# Patient Record
Sex: Male | Born: 1995
Health system: Southern US, Community
[De-identification: ages and names within clinical notes are randomized; demographics above are authoritative.]

---
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
## 2016-06-27 ENCOUNTER — Emergency Department (HOSPITAL_COMMUNITY)
Admission: EM | Admit: 2016-06-27 | Discharge: 2016-06-27 | Disposition: A | Discharge status: Home / Self Care | Payer: 59 | Attending: Emergency Medicine | Admitting: Emergency Medicine

## 2016-06-27 ENCOUNTER — Encounter (HOSPITAL_COMMUNITY): Payer: Self-pay | Admitting: Emergency Medicine

## 2016-06-27 DIAGNOSIS — Y999 Unspecified external cause status: Secondary | ICD-10-CM | POA: Diagnosis not present

## 2016-06-27 DIAGNOSIS — S81012A Laceration without foreign body, left knee, initial encounter: Secondary | ICD-10-CM | POA: Insufficient documentation

## 2016-06-27 DIAGNOSIS — Y939 Activity, unspecified: Secondary | ICD-10-CM | POA: Insufficient documentation

## 2016-06-27 DIAGNOSIS — W293XXA Contact with powered garden and outdoor hand tools and machinery, initial encounter: Secondary | ICD-10-CM | POA: Diagnosis not present

## 2016-06-27 DIAGNOSIS — Z23 Encounter for immunization: Secondary | ICD-10-CM | POA: Diagnosis not present

## 2016-06-27 DIAGNOSIS — Y929 Unspecified place or not applicable: Secondary | ICD-10-CM | POA: Insufficient documentation

## 2016-06-27 DIAGNOSIS — S81812A Laceration without foreign body, left lower leg, initial encounter: Secondary | ICD-10-CM

## 2016-06-27 MED ORDER — TETANUS-DIPHTH-ACELL PERTUSSIS 5-2.5-18.5 LF-MCG/0.5 IM SUSP
0.5000 mL | Freq: Once | INTRAMUSCULAR | Status: AC
  Administered 2016-06-27: 0.5 mL via INTRAMUSCULAR
  Filled 2016-06-27: qty 0.5

## 2016-06-27 MED ORDER — LIDOCAINE HCL 2 % IJ SOLN
INTRAMUSCULAR | Status: AC
  Administered 2016-06-27: 400 mg
  Filled 2016-06-27: qty 20

## 2016-06-27 NOTE — ED Provider Notes (Signed)
[facility]-EMERGENCY DEPT Provider Note   CSN: [id number] Arrival date & time: 06/27/16  1305  By signing my name below, I, [name], attest that this documentation has been prepared under the direction and in the presence of  [name], PA-C. Electronically Signed: [name], ED Scribe. 06/27/16. 2:03 PM.    History   Chief Complaint Chief Complaint  Patient presents with  . Extremity Laceration   The history is provided by the patient. No language interpreter was used.   HPI Comments:  Kenneth Castro is a 20 y.o. male who presents to the Emergency Department complaining of sudden onset moderate laceration with controlled bleeding to his left knee s/p an incident which occurred 1 hour ago. Pt states he was using a chainsaw when he sustained his injury. He is ambulatory. No alleviating factors noted. Pt denies being on any blood thinners any any other complaints at this time.   History reviewed. No pertinent past medical history.  There are no active problems to display for this patient.   History reviewed. No pertinent surgical history.   Home Medications    Prior to Admission medications   Not on File    Family History No family history on file.  Social History Social History  Substance Use Topics  . Smoking status: Never Smoker  . Smokeless tobacco: Not on file  . Alcohol use Yes     Allergies   Patient has no allergy information on record.   Review of Systems Review of Systems  Musculoskeletal: Negative for arthralgias.  Skin: Positive for wound.  All other systems reviewed and are negative.    Physical Exam Updated Vital Signs BP 157/90 (BP Location: Right Arm)   Pulse 99   Temp 98.7 F (37.1 C) (Oral)   Resp 18   SpO2 99%   Physical Exam  Constitutional: He is oriented to person, place, and time. He appears well-developed and well-nourished. No distress.  HENT:  Head: Normocephalic and atraumatic.  Eyes: Conjunctivae are normal.    Cardiovascular: Normal rate.   Pulmonary/Chest: Effort normal.  Abdominal: He exhibits no distension.  Musculoskeletal:  Right knee; Full active ROM without pain. No bony tenderness.  Neurological: He is alert and oriented to person, place, and time.  Strength and sensation intact throughout bilateral lower extremity.   Skin: Skin is warm and dry.  5 cm linear full thickness laceration to superior lateral left knee. No visualization or palpation of foreign body within a bloodless field.   Psychiatric: He has a normal mood and affect.  Nursing note and vitals reviewed.  ED Treatments / Results  DIAGNOSTIC STUDIES:  Oxygen Saturation is 99% on RA, normal by my interpretation.    COORDINATION OF CARE:  1:49 PM Discussed treatment plan with pt at bedside and pt agreed to plan.  Labs (all labs ordered are listed, but only abnormal results are displayed) Labs Reviewed - No data to display  EKG  EKG Interpretation None       Radiology No results found.  Procedures Procedures (including critical care time)  LACERATION REPAIR Performed by: [name] Consent: Verbal consent obtained. Risks and benefits: risks, benefits and alternatives were discussed Patient identity confirmed: provided demographic data Time out performed prior to procedure Prepped and Draped in normal sterile fashion Wound explored Laceration Location: left knee Laceration Length: 5cm No Foreign Bodies seen or palpated Anesthesia: local infiltration Local anesthetic: lidocaine 1% without epinephrine Anesthetic total: 10 ml Irrigation method: syringe Amount of cleaning: standard  Skin closure: 4-0 ethilon Number of sutures or staples: 9 Technique: simple interrupted Patient tolerance: Patient tolerated the procedure well with no immediate complications.   Medications Ordered in ED Medications  lidocaine (XYLOCAINE) 2 % (with pres) injection (not administered)  Tdap (BOOSTRIX) injection 0.5 mL  (not administered)     Initial Impression / Assessment and Plan / ED Course  I have reviewed the triage vital signs and the nursing notes.  Pertinent labs & imaging results that were available during my care of the patient were reviewed by me and considered in my medical decision making (see chart for details).  Clinical Course     Tetanus updated in ED. Laceration occurred < 12 hours prior to repair. Discussed laceration care with pt and answered questions. Pt to f-u for suture removal in 10-14 days and wound check sooner should there be signs of dehiscence or infection. Pt is hemodynamically stable with no complaints prior to dc.     Final Clinical Impressions(s) / ED Diagnoses   Final diagnoses:  Laceration of left lower extremity, initial encounter    New Prescriptions New Prescriptions   No medications on file   I personally performed the services described in this documentation, which was scribed in my presence. The recorded information has been reviewed and is accurate.     [name], PA-C 06/27/16 1443    [name], DO 06/27/16 [phone]

## 2016-06-27 NOTE — Discharge Instructions (Signed)
Take Tylenol or Motrin for pain. Keep your wound clean and dry. Return to the emergency department in 10-14 days for suture removal. Return sooner if you develop fever, increased redness, swelling, pus drainage, or any new or concerning symptoms.

## 2016-06-27 NOTE — ED Notes (Signed)
Patient states that was using a chainsaw today and it kicked back and came across his knee.  Patient has 1 1/2 inch laceration on the left lateral knee.  Bleeding is controlled, patient does not c/o any pain at this time.

## 2016-06-27 NOTE — ED Notes (Signed)
Suture cart and lidocaine at bedside.  

## 2016-06-27 NOTE — ED Notes (Signed)
Patient d/c'd self care.  F/U reviewed.  Patient verbalized understanding. 

## 2016-06-27 NOTE — ED Triage Notes (Signed)
Per pt, states chain saw to left leg-2 inch laceration-bleeding controlled

## 2017-02-02 ENCOUNTER — Ambulatory Visit (INDEPENDENT_AMBULATORY_CARE_PROVIDER_SITE_OTHER): Payer: 59 | Admitting: Primary Care

## 2017-02-02 ENCOUNTER — Encounter: Payer: Self-pay | Admitting: Primary Care

## 2017-02-02 VITALS — BP 130/84 | HR 87 | Temp 98.4°F | Ht 68.5 in | Wt 166.8 lb

## 2017-02-02 DIAGNOSIS — R5383 Other fatigue: Secondary | ICD-10-CM

## 2017-02-02 LAB — CBC
HCT: 46.7 % (ref 39.0–52.0)
Hemoglobin: 16.2 g/dL (ref 13.0–17.0)
MCHC: 34.7 g/dL (ref 30.0–36.0)
MCV: 84.8 fl (ref 78.0–100.0)
Platelets: 225 10*3/uL (ref 150.0–400.0)
RBC: 5.51 Mil/uL (ref 4.22–5.81)
RDW: 12.1 % (ref 11.5–14.6)
WBC: 7.8 10*3/uL (ref 4.5–10.5)

## 2017-02-02 LAB — COMPREHENSIVE METABOLIC PANEL
ALT: 18 U/L (ref 0–53)
AST: 18 U/L (ref 0–37)
Albumin: 5 g/dL (ref 3.5–5.2)
Alkaline Phosphatase: 52 U/L (ref 39–117)
BUN: 16 mg/dL (ref 6–23)
CO2: 28 mEq/L (ref 19–32)
Calcium: 10.1 mg/dL (ref 8.4–10.5)
Chloride: 104 mEq/L (ref 96–112)
Creatinine, Ser: 1.36 mg/dL (ref 0.40–1.50)
GFR: 70.68 mL/min (ref >60.00)
Glucose, Bld: 81 mg/dL (ref 70–99)
Potassium: 4.1 mEq/L (ref 3.5–5.1)
Sodium: 141 mEq/L (ref 135–145)
Total Bilirubin: 1 mg/dL (ref 0.2–1.2)
Total Protein: 7.5 g/dL (ref 6.0–8.3)

## 2017-02-02 LAB — VITAMIN B12: Vitamin B-12: 547 pg/mL (ref 211–911)

## 2017-02-02 LAB — TSH: TSH: 1.61 u[IU]/mL (ref 0.35–5.50)

## 2017-02-02 LAB — VITAMIN D 25 HYDROXY (VIT D DEFICIENCY, FRACTURES): VITD: 53.09 ng/mL (ref 30.00–100.00)

## 2017-02-02 NOTE — Patient Instructions (Signed)
Complete lab work prior to leaving today.  You should be getting 7-9 hours of sleep every night.  It's important to improve your diet by reducing consumption of fast food, fried food, processed snack foods, sugary drinks. Increase consumption of fresh vegetables and fruits, whole grains, water.  Ensure you are drinking 64 ounces of water daily.  I'll be in touch once I receive your lab results.  It was a pleasure to meet you today! Please don't hesitate to call me with any questions. Welcome to [facility]!   Fatigue Fatigue is feeling tired all of the time, a lack of energy, or a lack of motivation. Occasional or mild fatigue is often a normal response to activity or life in general. However, long-lasting (chronic) or extreme fatigue may indicate an underlying medical condition. Follow these instructions at home: Watch your fatigue for any changes. The following actions may help to lessen any discomfort you are feeling:  Talk to your health care provider about how much sleep you need each night. Try to get the required amount every night.  Take medicines only as directed by your health care provider.  Eat a healthy and nutritious diet. Ask your health care provider if you need help changing your diet.  Drink enough fluid to keep your urine clear or pale yellow.  Practice ways of relaxing, such as yoga, meditation, massage therapy, or acupuncture.  Exercise regularly.  Change situations that cause you stress. Try to keep your work and personal routine reasonable.  Do not abuse illegal drugs.  Limit alcohol intake to no more than 1 drink per day for nonpregnant women and 2 drinks per day for men. One drink equals 12 ounces of beer, 5 ounces of wine, or 1 ounces of hard liquor.  Take a multivitamin, if directed by your health care provider.  Contact a health care provider if:  Your fatigue does not get better.  You have a fever.  You have unintentional weight loss or  gain.  You have headaches.  You have difficulty: ? Falling asleep. ? Sleeping throughout the night.  You feel angry, guilty, anxious, or sad.  You are unable to have a bowel movement (constipation).  You skin is dry.  Your legs or another part of your body is swollen. Get help right away if:  You feel confused.  Your vision is blurry.  You feel faint or pass out.  You have a severe headache.  You have severe abdominal, pelvic, or back pain.  You have chest pain, shortness of breath, or an irregular or fast heartbeat.  You are unable to urinate or you urinate less than normal.  You develop abnormal bleeding, such as bleeding from the rectum, vagina, nose, lungs, or nipples.  You vomit blood.  You have thoughts about harming yourself or committing suicide.  You are worried that you might harm someone else. This information is not intended to replace advice given to you by your health care provider. Make sure you discuss any questions you have with your health care provider. Document Released: 05/31/2007 Document Revised: 01/09/2016 Document Reviewed: 12/05/2013 Elsevier Interactive Patient Education  [employer]2018 Elsevier Inc.

## 2017-02-02 NOTE — Assessment & Plan Note (Signed)
Present for 2 years. Does have some mild depression, unlikely to be contributing. Suspect this could be contributed to his limited amount of sleep, poor diet, and lack of exercise.  Will check labs today to rule out metabolic cause. Discussed to work on increasing sleep and improving diet. Will await lab results.

## 2017-02-02 NOTE — Progress Notes (Signed)
   Subjective:    Patient ID: Kenneth Castro, male    DOB: [birth year], 21 y.o.   MRN: [id number]  HPI  [name] is a 21 year old male who presents today to establish care and discuss the problems mentioned below. Will obtain old records.  1) Fatigue/Tired: Present for the past 2 years. He's feeling tired nearly everyday, throughout the entire day. He will take a 2 hour nap during the middle of the day. He will sleep 5-6 hours nightly on average. He's had intermittent dizziness with weakness since age 21 that will improve with rest and when he eats. He denies weakness, bloody stools, night sweats, unexplained weight loss, snoring, difficulty falling and staying asleep.  Diet currently consists of:  Breakfast: Skips Lunch: Fast food Dinner: Meat, vegetable, starch Snacks: Rice crispy treats Desserts: None Beverages: Recently water, soda  Exercise: He does not currently exercise, is active at work daily.    Review of Systems  Constitutional: Positive for fatigue. Negative for fever.  Respiratory: Negative for shortness of breath.   Cardiovascular: Negative for chest pain.  Gastrointestinal: Negative for abdominal pain and blood in stool.  Allergic/Immunologic: Positive for environmental allergies.  Neurological: Negative for weakness and headaches.  Psychiatric/Behavioral:       Mild intermittent depression, no anxiety. Overall manages well on his own.       No past medical history on file.   Social History   Social History  . Marital status: Single    Spouse name: N/A  . Number of children: N/A  . Years of education: N/A   Occupational History  . Not on file.   Social History Main Topics  . Smoking status: Never Smoker  . Smokeless tobacco: Never Used  . Alcohol use Yes  . Drug use: Unknown  . Sexual activity: Not on file   Other Topics Concern  . Not on file   Social History Narrative   Single.   No children.   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   Enjoys spending time with friends, camping, hunting, fishing.    No past surgical history on file.  Family History  Problem Relation Age of Onset  . Hypothyroidism Maternal Grandmother   . Breast cancer Paternal Grandmother   . Diabetes Paternal Grandfather   . Prostate cancer Paternal Grandfather   . Breast cancer Paternal Aunt     Allergies  Allergen Reactions  . Penicillins Swelling    No current outpatient prescriptions on file prior to visit.   No current facility-administered medications on file prior to visit.     BP 130/84   Pulse 87   Temp 98.4 F (36.9 C) (Oral)   Ht 5' 8.5" (1.74 m)   Wt 166 lb 12.8 oz (75.7 kg)   SpO2 98%   BMI 24.99 kg/m    Objective:   Physical Exam  Constitutional: He is oriented to person, place, and time. He appears well-nourished.  Eyes: EOM are normal.  Neck: Neck supple. No thyromegaly present.  Cardiovascular: Normal rate and regular rhythm.   Pulmonary/Chest: Effort normal and breath sounds normal. He has no wheezes. He has no rales.  Neurological: He is alert and oriented to person, place, and time. No cranial nerve deficit.  Skin: Skin is warm and dry.  Psychiatric: He has a normal mood and affect.          Assessment & Plan:

## 2017-05-20 ENCOUNTER — Encounter: Payer: Self-pay | Admitting: Primary Care

## 2017-05-20 ENCOUNTER — Ambulatory Visit (INDEPENDENT_AMBULATORY_CARE_PROVIDER_SITE_OTHER): Payer: 59 | Admitting: Primary Care

## 2017-05-20 VITALS — BP 142/78 | HR 78 | Temp 97.9°F | Wt 172.0 lb

## 2017-05-20 DIAGNOSIS — R350 Frequency of micturition: Secondary | ICD-10-CM | POA: Insufficient documentation

## 2017-05-20 LAB — HEMOGLOBIN A1C: HEMOGLOBIN A1C: 5.1 % (ref 4.6–6.5)

## 2017-05-20 LAB — POC URINALSYSI DIPSTICK (AUTOMATED)
Bilirubin, UA: NEGATIVE
Blood, UA: NEGATIVE
Glucose, UA: NEGATIVE
Ketones, UA: NEGATIVE
LEUKOCYTES UA: NEGATIVE
NITRITE UA: NEGATIVE
PH UA: 6 (ref 5.0–8.0)
Protein, UA: NEGATIVE
Spec Grav, UA: >=1.03 — AB (ref 1.010–1.025)
UROBILINOGEN UA: 0.2 U/dL

## 2017-05-20 NOTE — Progress Notes (Signed)
   Subjective:    Patient ID: Kenneth Castro, male    DOB: 1995/12/07, 21 y.o.   MRN: [id number]  HPI  Kenneth Castro is a 21 year old male who presents today with a chief complaint of polyuria and polydipsia. His symptoms have been present for the past 1 year. He drinks mostly water (48 ounces daily), sometimes Gatorade and soda. He also drinks up to three glasses of sweet tea with each meal. He has noticed nocturia and will get up 2-3 times nightly.   He denies hematuria, fevers, abdominal pain, penile discharge. dysuria, penile pain, testicular pain. Family history of diabetes in his father's side. Mother is prediabetic.   Diet currently consists of:  Breakfast: Bacon, egg, cheese biscuit Lunch: Meat, mac and cheese, starch (fried foods) Dinner: [employer], burgers, fries Snacks: Granola bars, chips Desserts: None Beverages: Water, sweet tea, soda, Gatorade.   Exercise:   Review of Systems  Constitutional: Positive for fatigue.  Gastrointestinal: Negative for abdominal pain, constipation, diarrhea and nausea.  Endocrine: Positive for polydipsia and polyuria.  Genitourinary: Positive for dysuria and frequency. Negative for discharge and flank pain.  Neurological: Negative for numbness.       No past medical history on file.   Social History   Social History  . Marital status: Single    Spouse name: N/A  . Number of children: N/A  . Years of education: N/A   Occupational History  . Not on file.   Social History Main Topics  . Smoking status: Never Smoker  . Smokeless tobacco: Never Used  . Alcohol use Yes  . Drug use: Unknown  . Sexual activity: Not on file   Other Topics Concern  . Not on file   Social History Narrative   Single.   No children.   Student at [employer].   Enjoys spending time with friends, camping, hunting, fishing.    No past surgical history on file.  Family History  Problem Relation Age of Onset  . Hypothyroidism  Maternal Grandmother   . Breast cancer Paternal Grandmother   . Diabetes Paternal Grandmother   . Diabetes Paternal Grandfather   . Prostate cancer Paternal Grandfather   . Breast cancer Paternal Aunt     Allergies  Allergen Reactions  . Penicillins Swelling    No current outpatient prescriptions on file prior to visit.   No current facility-administered medications on file prior to visit.     BP (!) 142/78   Pulse 78   Temp 97.9 F (36.6 C) (Oral)   Wt 172 lb (78 kg)   BMI 25.77 kg/m    Objective:   Physical Exam  Constitutional: He appears well-nourished.  Neck: Neck supple.  Cardiovascular: Normal rate and regular rhythm.   Pulmonary/Chest: Effort normal and breath sounds normal.  Abdominal: Soft. Bowel sounds are normal. There is no tenderness. There is no CVA tenderness.  Skin: Skin is warm and dry.  Psychiatric: He has a normal mood and affect.          Assessment & Plan:

## 2017-05-20 NOTE — Assessment & Plan Note (Signed)
Also with nocturia x 1 year. Work up in June for fatigue unremarkable including TSH and CMP. Doubt diabetes as a cause but will check A1C. UA today unremarkable. Discussed to stop caffeine consumption after 3 pm and to limit fluids at bedtime.  Also recommended he work on improving his diet and start exercising.

## 2017-05-20 NOTE — Patient Instructions (Addendum)
Complete lab work prior to leaving today. I will notify you of your results once received.   Ensure you are consuming 64 ounces of water daily, more if working outside.  Limit sweet tea, Gatorade, and soda.  Do not drink caffeine after 3 pm in the afternoon.  It was a pleasure to see you today!

## 2018-09-20 ENCOUNTER — Ambulatory Visit: Payer: 59 | Admitting: Primary Care

## 2018-09-20 DIAGNOSIS — Z0289 Encounter for other administrative examinations: Secondary | ICD-10-CM

## 2018-09-21 ENCOUNTER — Ambulatory Visit: Payer: 59 | Admitting: Primary Care

## 2018-09-21 ENCOUNTER — Encounter: Payer: Self-pay | Admitting: Primary Care

## 2018-09-21 VITALS — BP 130/86 | HR 76 | Temp 98.2°F | Ht 68.5 in | Wt 179.0 lb

## 2018-09-21 DIAGNOSIS — R42 Dizziness and giddiness: Secondary | ICD-10-CM

## 2018-09-21 DIAGNOSIS — R5383 Other fatigue: Secondary | ICD-10-CM | POA: Diagnosis not present

## 2018-09-21 DIAGNOSIS — R531 Weakness: Secondary | ICD-10-CM | POA: Diagnosis not present

## 2018-09-21 LAB — COMPREHENSIVE METABOLIC PANEL
ALBUMIN: 4.6 g/dL (ref 3.5–5.2)
ALT: 21 U/L (ref 0–53)
AST: 15 U/L (ref 0–37)
Alkaline Phosphatase: 55 U/L (ref 39–117)
BUN: 11 mg/dL (ref 6–23)
CHLORIDE: 102 meq/L (ref 96–112)
CO2: 30 mEq/L (ref 19–32)
Calcium: 9.7 mg/dL (ref 8.4–10.5)
Creatinine, Ser: 1.2 mg/dL (ref 0.40–1.50)
GFR: 75.64 mL/min (ref >60.00)
Glucose, Bld: 81 mg/dL (ref 70–99)
POTASSIUM: 4.5 meq/L (ref 3.5–5.1)
Sodium: 141 mEq/L (ref 135–145)
Total Bilirubin: 0.7 mg/dL (ref 0.2–1.2)
Total Protein: 7 g/dL (ref 6.0–8.3)

## 2018-09-21 LAB — VITAMIN B12: Vitamin B-12: 464 pg/mL (ref 211–911)

## 2018-09-21 LAB — CBC
HEMATOCRIT: 47.9 % (ref 39.0–52.0)
Hemoglobin: 16.4 g/dL (ref 13.0–17.0)
MCHC: 34.3 g/dL (ref 30.0–36.0)
MCV: 86.1 fl (ref 78.0–100.0)
Platelets: 206 10*3/uL (ref 150.0–400.0)
RBC: 5.56 Mil/uL (ref 4.22–5.81)
RDW: 12.3 % (ref 11.5–15.5)
WBC: 5.7 10*3/uL (ref 4.0–10.5)

## 2018-09-21 LAB — VITAMIN D 25 HYDROXY (VIT D DEFICIENCY, FRACTURES): VITD: 31.25 ng/mL (ref 30.00–100.00)

## 2018-09-21 LAB — TSH: TSH: 1.91 u[IU]/mL (ref 0.35–4.50)

## 2018-09-21 NOTE — Progress Notes (Signed)
Subjective:    Patient ID: Kenneth Castro, male    DOB: February 08, 1996, 23 y.o.   MRN: [id number]  HPI  [name] is a 23 year old male with a history of chronic fatigue who presents today with a chief complaint of fatigue.  He was evaluated in June 2018 with reports of a two year history of fatigue, feeling tired daily. He endorsed napping 2 hours during the day, sleeping 5-6 hours nightly.  Since his last visit he continues to feel fatigued. He endorses a healthier diet, is sleeping 7-8 hours nightly. He wakes around 7-8 am and feels tired, doesn't wake during the night. Feels sluggish throughout the day. He is napping for 2 hours 1-2 times weekly. He will experience symptoms of feeling shaky, light headed as though be may pass out. His legs will feel like "jello". This occurs 1-2 times weekly and has been so since the age of 23.   He denies feeling depressed and anxious, unexplained weight loss, bloody stools, night sweats, penile discharge, nausea, dysuria. He's not sure if he snores, denies waking during the night.   BP Readings from Last 3 Encounters:  09/21/18 130/86  05/20/17 (!) 142/78  02/02/17 130/84   Diet currently consists of:  Breakfast: Cereal Lunch: Sandwich, chips, sometimes fast food Dinner: Restaurants, meat, vegetable, starch Snacks: Crackers Desserts: 2-3 times weekly  Beverages: Water, sweet tea, some soda, social alcohol  Exercise: He is not exercising   Review of Systems  Constitutional: Positive for fatigue. Negative for appetite change and unexpected weight change.  Respiratory: Negative for shortness of breath.   Cardiovascular: Negative for chest pain and palpitations.  Gastrointestinal: Negative for nausea.  Musculoskeletal: Negative for arthralgias.  Neurological:       Intermittent symptoms of feeling dizzy/shaky with weakness to his legs.   Psychiatric/Behavioral:       Denies concerns for anxiety and depression       No past medical  history on file.   Social History   Socioeconomic History  . Marital status: Single    Spouse name: Not on file  . Number of children: Not on file  . Years of education: Not on file  . Highest education level: Not on file  Occupational History  . Not on file  Social Needs  . Financial resource strain: Not on file  . Food insecurity:    Worry: Not on file    Inability: Not on file  . Transportation needs:    Medical: Not on file    Non-medical: Not on file  Tobacco Use  . Smoking status: Never Smoker  . Smokeless tobacco: Never Used  Substance and Sexual Activity  . Alcohol use: Yes  . Drug use: Not on file  . Sexual activity: Not on file  Lifestyle  . Physical activity:    Days per week: Not on file    Minutes per session: Not on file  . Stress: Not on file  Relationships  . Social connections:    Talks on phone: Not on file    Gets together: Not on file    Attends religious service: Not on file    Active member of club or organization: Not on file    Attends meetings of clubs or organizations: Not on file    Relationship status: Not on file  . Intimate partner violence:    Fear of current or ex partner: Not on file    Emotionally abused: Not on file  Physically abused: Not on file    Forced sexual activity: Not on file  Other Topics Concern  . Not on file  Social History Narrative   Single.   No children.   Student at [employer]   Enjoys spending time with friends, camping, hunting, fishing.    No past surgical history on file.  Family History  Problem Relation Age of Onset  . Hypothyroidism Maternal Grandmother   . Breast cancer Paternal Grandmother   . Diabetes Paternal Grandmother   . Diabetes Paternal Grandfather   . Prostate cancer Paternal Grandfather   . Breast cancer Paternal Aunt     Allergies  Allergen Reactions  . Penicillins Swelling and Anaphylaxis    No current outpatient medications on file prior to visit.    No current facility-administered medications on file prior to visit.     BP 130/86   Pulse 76   Temp 98.2 F (36.8 C) (Oral)   Ht 5' 8.5" (1.74 m)   Wt 179 lb (81.2 kg)   SpO2 99%   BMI 26.82 kg/m    Objective:   Physical Exam  Constitutional: He is oriented to person, place, and time. He appears well-nourished.  HENT:  Mouth/Throat: No oropharyngeal exudate.  Eyes: Pupils are equal, round, and reactive to light. EOM are normal.  Neck: Neck supple. No thyromegaly present.  Cardiovascular: Normal rate and regular rhythm.  Respiratory: Effort normal and breath sounds normal.  GI: Soft. Bowel sounds are normal. There is no abdominal tenderness.  Musculoskeletal: Normal range of motion.  Neurological: He is alert and oriented to person, place, and time.  Skin: Skin is warm and dry.  Psychiatric: He has a normal mood and affect.           Assessment & Plan:

## 2018-09-21 NOTE — Patient Instructions (Signed)
Stop by the lab prior to leaving today. I will notify you of your results once received.   Start exercising. You should be getting 150 minutes of moderate intensity exercise weekly.  Continue to work on a healthy diet. Ensure you are consuming 64 ounces of water daily.  It was a pleasure to see you today!   

## 2018-09-21 NOTE — Assessment & Plan Note (Signed)
Chronic, no changes within the last 2 years despite healthier diet and increased sleep. Exam today unremarkable.  Check labs today, add on testosterone, STD testing. Consider neurology evaluation given persistent fatigue, and intermittent symptoms of dizziness/weakness. Recommended regular exercise, continue to work on diet. Await labs.

## 2018-09-22 LAB — HIV ANTIBODY (ROUTINE TESTING W REFLEX): HIV 1&2 Ab, 4th Generation: NONREACTIVE

## 2018-09-22 LAB — RPR: RPR: NONREACTIVE

## 2018-09-25 LAB — TESTOS,TOTAL,FREE AND SHBG (FEMALE)
FREE TESTOSTERONE: 129.9 pg/mL (ref 35.0–155.0)
SEX HORMONE BINDING: 40 nmol/L (ref 10–50)
Testosterone, Total, LC-MS-MS: 792 ng/dL (ref 250–1100)

## 2018-09-26 ENCOUNTER — Other Ambulatory Visit: Payer: Self-pay | Admitting: Primary Care

## 2018-09-26 DIAGNOSIS — R5382 Chronic fatigue, unspecified: Secondary | ICD-10-CM

## 2018-11-21 ENCOUNTER — Telehealth: Payer: Self-pay

## 2018-11-21 NOTE — Telephone Encounter (Signed)
Due to current COVID 19 pandemic, our office is severely reducing in office visits for at least the next 2 weeks, in order to minimize the risk to our patients and healthcare providers.   I called pt, he is agreeable to a virtual visit.  Pt understands that although there may be some limitations with this type of visit, we will take all precautions to reduce any security or privacy concerns.  Pt understands that this will be treated like an in office visit and we will file with pt's insurance, and there may be a patient responsible charge related to this service.  Pt's email is [email or web address]. Pt understands that the [employer] webex software must be downloaded and operational on the device pt plans to use for the visit.  Pt's meds, allergies, and PMH were updated.  Pt has never had a sleep study but does endorse snoring.  Pt was instructed on how to measure his neck size.  Pt's weight is 172lbs and he is 5'11.  Epworth Sleepiness Scale 0= would never doze 1= slight chance of dozing 2= moderate chance of dozing 3= high chance of dozing  Sitting and reading: 1 Watching TV: 1 Sitting inactive in a public place (ex. Theater or meeting): 1 As a passenger in a car for an hour without a break: 3 Lying down to rest in the afternoon: 2 Sitting and talking to someone: 0 Sitting quietly after lunch (no alcohol): 3 In a car, while stopped in traffic: 0 Total: 11  FSS: 48

## 2018-11-24 ENCOUNTER — Ambulatory Visit (INDEPENDENT_AMBULATORY_CARE_PROVIDER_SITE_OTHER): Payer: 59 | Admitting: Neurology

## 2018-11-24 ENCOUNTER — Other Ambulatory Visit: Payer: Self-pay

## 2018-11-24 ENCOUNTER — Encounter: Payer: Self-pay | Admitting: Neurology

## 2018-11-24 DIAGNOSIS — R0683 Snoring: Secondary | ICD-10-CM

## 2018-11-24 DIAGNOSIS — R6889 Other general symptoms and signs: Secondary | ICD-10-CM

## 2018-11-24 DIAGNOSIS — R519 Headache, unspecified: Secondary | ICD-10-CM

## 2018-11-24 DIAGNOSIS — G4719 Other hypersomnia: Secondary | ICD-10-CM | POA: Diagnosis not present

## 2018-11-24 DIAGNOSIS — G4710 Hypersomnia, unspecified: Secondary | ICD-10-CM

## 2018-11-24 DIAGNOSIS — R51 Headache: Secondary | ICD-10-CM | POA: Diagnosis not present

## 2018-11-24 DIAGNOSIS — R531 Weakness: Secondary | ICD-10-CM

## 2018-11-24 NOTE — Progress Notes (Signed)
[name], MD, PhD [facility] [address], [address] [address] [place], [place] [address]   Virtual Visit via Video Note on 11/24/2018:  I connected with Faizan on 11/24/18 at  9:00 AM EDT by a video enabled telemedicine application and verified that I am speaking with the correct person using two identifiers.   I discussed the limitations of evaluation and management by telemedicine and the availability of in person appointments. The patient expressed understanding and agreed to proceed.  History of Present Illness:    [name] is a 23 year old right-handed gentleman with a benign medical history, with whom I am conducting a virtual, video based new patient visit via Webex in lieu of a face-to-face visit for evaluation of his sleep disorder, in particular, his excessive daytime somnolence. The patient is unaccompanied today and joins via cell ph from home. He is referred by his primary care nurse practitioner, Vernona Rieger and I reviewed her is office note from 09/21/2018.  His Epworth sleepiness score is 15 out of 24, fatigue severity score is 54 out of 63.  He had blood work on 09/21/2018 which I reviewed: He had CBC, testosterone and free testosterone, CMP, HIV, RPR, TSH, B12, and vitamin D. All labs were unremarkable with the exception of low normal vitamin D around 31. He was advised to start takingand over-the-counter vitamin D supplement and he has been taking 2000 units each day on average. He reports a approximately 4+ year history of feeling tired and sleepy during the day and needing to take a nap. He has not falling asleep standing up but feels like he could at times. He has not actually fallen asleep driving but does admit that he has dozed off very briefly at times and veered out of his [place], thankfully no other major incidences, no car accidents. He has in the past to soften class when he was even in middle school and high school and it  seems like it's been worse since college. He has no family history of narcolepsy or sleep apnea. For the past several years intermittently he has had episodes of feeling weak suddenly. He has weakness in his legs sometimes with feeling shaky and sometimes in his arms. He has never fallen. It is transient and sometimes he wonders if it was related to not eating right and having a low blood sugar at the time. He remembers when he used to play basketball after school he would certainly have to stop and sit down because he felt weak and jittery. He has never had any loss of consciousness. He denies telltale symptoms of sleep paralysis, does report dreaming easily even and shorter naps. He may take a 20 minute nap or set an alarm for 1-1/2 hours so he does not sleep too much but never really wakes up fully rested after a nap. He is at [employer], he works in Therap[employer], [employer]. He lives with his father. He is a nonsmoker and denies utilizing any illicit drugs, drinks alcohol occasionally socially, has reduced his caffeine intake from previously 2-3 sodas per day to 1 soda and maybe one glass of tea per day but has not found that caffeine in higher amounts had a big impact on his ability to stay awake. He does not typically watch TV in his bedroom, he has an outside dog. He has 1 brother who lives in [place]. He has had occasional morning headaches, denies nocturia, bedtime around 11 and rise time between 7 and  8. Even when he sleeps 8 or 9 hours which he tries to get consistently, he does not feel fully rested and it is harder for him to wake up. It is at night sometimes difficult for him to fall asleep, takes up to 30 minutes to fall asleep. He snores a little bit per girlfriend's report. He has never had any gasping sensation or witnessed apneic pauses while asleep per friends or family.  His Past Medical History Is Significant For: No past medical history on file.  His Past Surgical History Is Significant For: No past  surgical history on file.  His Family History Is Significant For: Family History  Problem Relation Age of Onset   Hypothyroidism Maternal Grandmother    Breast cancer Paternal Grandmother    Diabetes Paternal Grandmother    Diabetes Paternal Grandfather    Prostate cancer Paternal Grandfather    Breast cancer Paternal Aunt     His Social History Is Significant For: Social History   Socioeconomic History   Marital status: Single    Spouse name: Not on file   Number of children: Not on file   Years of education: Not on file   Highest education level: Not on file  Occupational History   Not on file  Social Needs   Financial resource strain: Not on file   Food insecurity:    Worry: Not on file    Inability: Not on file   Transportation needs:    Medical: Not on file    Non-medical: Not on file  Tobacco Use   Smoking status: Never Smoker   Smokeless tobacco: Never Used  Substance and Sexual Activity   Alcohol use: Yes   Drug use: Not on file   Sexual activity: Not on file  Lifestyle   Physical activity:    Days per week: Not on file    Minutes per session: Not on file   Stress: Not on file  Relationships   Social connections:    Talks on phone: Not on file    Gets together: Not on file    Attends religious service: Not on file    Active member of club or organization: Not on file    Attends meetings of clubs or organizations: Not on file    Relationship status: Not on file  Other Topics Concern   Not on file  Social History Narrative   Single.   No children.   Student at [employer].   Enjoys spending time with friends, camping, hunting, fishing.   1 soda, occasional tea    His Allergies Are:  Allergies  Allergen Reactions   Penicillins Swelling and Anaphylaxis  :   His Current Medications Are:  No outpatient encounter medications on file as of 11/24/2018.   No facility-administered encounter medications on file as  of 11/24/2018.   :   Review of Systems:  Out of a complete 14 point review of systems, all are reviewed and negative with the exception of these symptoms as listed below:  Observations/Objective:  The most recent available vital signs for my review are from 09/21/2018: Blood pressure 130/86, pulse 76, temperature 98.2, weight 179 for a BMI of 26.82.  His most recent weight at home by self report is 172 a few days ago, neck circumference by self-report is 17 inches. He is pleasant, conversant, in no acute distress, extraocular movements are preserved, face is symmetric, speech clear without dysarthria or voice tremor or hypophonia noted. Airway examination reveals  a mildly crowded airway secondary to smaller airway entry, perhaps prominent uvula but tonsils not fully visualized. Mallampati appears to be class II. Tongue protrudes centrally and palate elevates symmetrically, utilize a flashlight for the airway examination. His walking is unremarkable, upper body movements and coordination unremarkable.  Assessment and Plan:  In summary, Mr. Jearld LeschGarrett Weltz is a 23 year old right-handed gentleman with a benign medical history, with whom I am conducting a virtual, video based new patient visit via Webex in lieu of a face-to-face visit for evaluation of his sleep disorder, in particular, his excessive daytime somnolence. His history and physical exam albeit limited physical exam is not telltale for obstructive sleep apnea. I think it may be more likely that he has an underlying hypersomnolence disorder with the main differential diagnosis of narcolepsy, potentially with cataplexy and idiopathic hypersomnolence. I talked with patient at length about these possibilities. He does seem to have good sleep hygiene and is encouraged to maintain a set sleep schedule and good sleep hygiene. He is currently not on any prescription medication or sleep aid, even over-the-counter. The patient is advised to proceed with  extended sleep study testing in the form of nocturnal polysomnogram, followed by a next day nap study, called MSLT (mean sleep latency test). I explained the sleep study procedures to the patient. He understands that we will have to delay testing until it is deemed safe to reopen the sleep lab for patients again. I plan to see him back after sleep testing is completed. I answered all his questions today and he is in agreement with the plan.   Follow Up Instructions:  1. PSG with next day MSLT when Feasible and considered safe in the sleep lab. 2. Pursue good sleep hygiene.  3. Do not drive when sleepy.  4. FU post testing.    I discussed the assessment and treatment plan with the patient. The patient was provided an opportunity to ask questions and all were answered. The patient agreed with the plan and demonstrated an understanding of the instructions.   The patient was advised to call back or seek an in-person evaluation if the symptoms worsen or if the condition fails to improve as anticipated.  I provided 30 minutes of non-face-to-face time during this encounter.   Huston FoleySaima Raysa Bosak, MD

## 2019-03-27 ENCOUNTER — Ambulatory Visit
Admission: RE | Admit: 2019-03-27 | Discharge: 2019-03-27 | Disposition: A | Discharge status: Home / Self Care | Payer: No Typology Code available for payment source | Source: Ambulatory Visit | Attending: Nurse Practitioner | Admitting: Nurse Practitioner

## 2019-03-27 ENCOUNTER — Other Ambulatory Visit: Payer: Self-pay | Admitting: Nurse Practitioner

## 2019-03-27 DIAGNOSIS — Z021 Encounter for pre-employment examination: Secondary | ICD-10-CM

## 2020-08-29 IMAGING — CR CHEST  1 VIEW
1 series · 1 of 1 positions shown · non-contrast
Comparison: None.

CLINICAL DATA: Pre-employment examination.

EXAM:
CHEST  1 VIEW

[w chest pa]
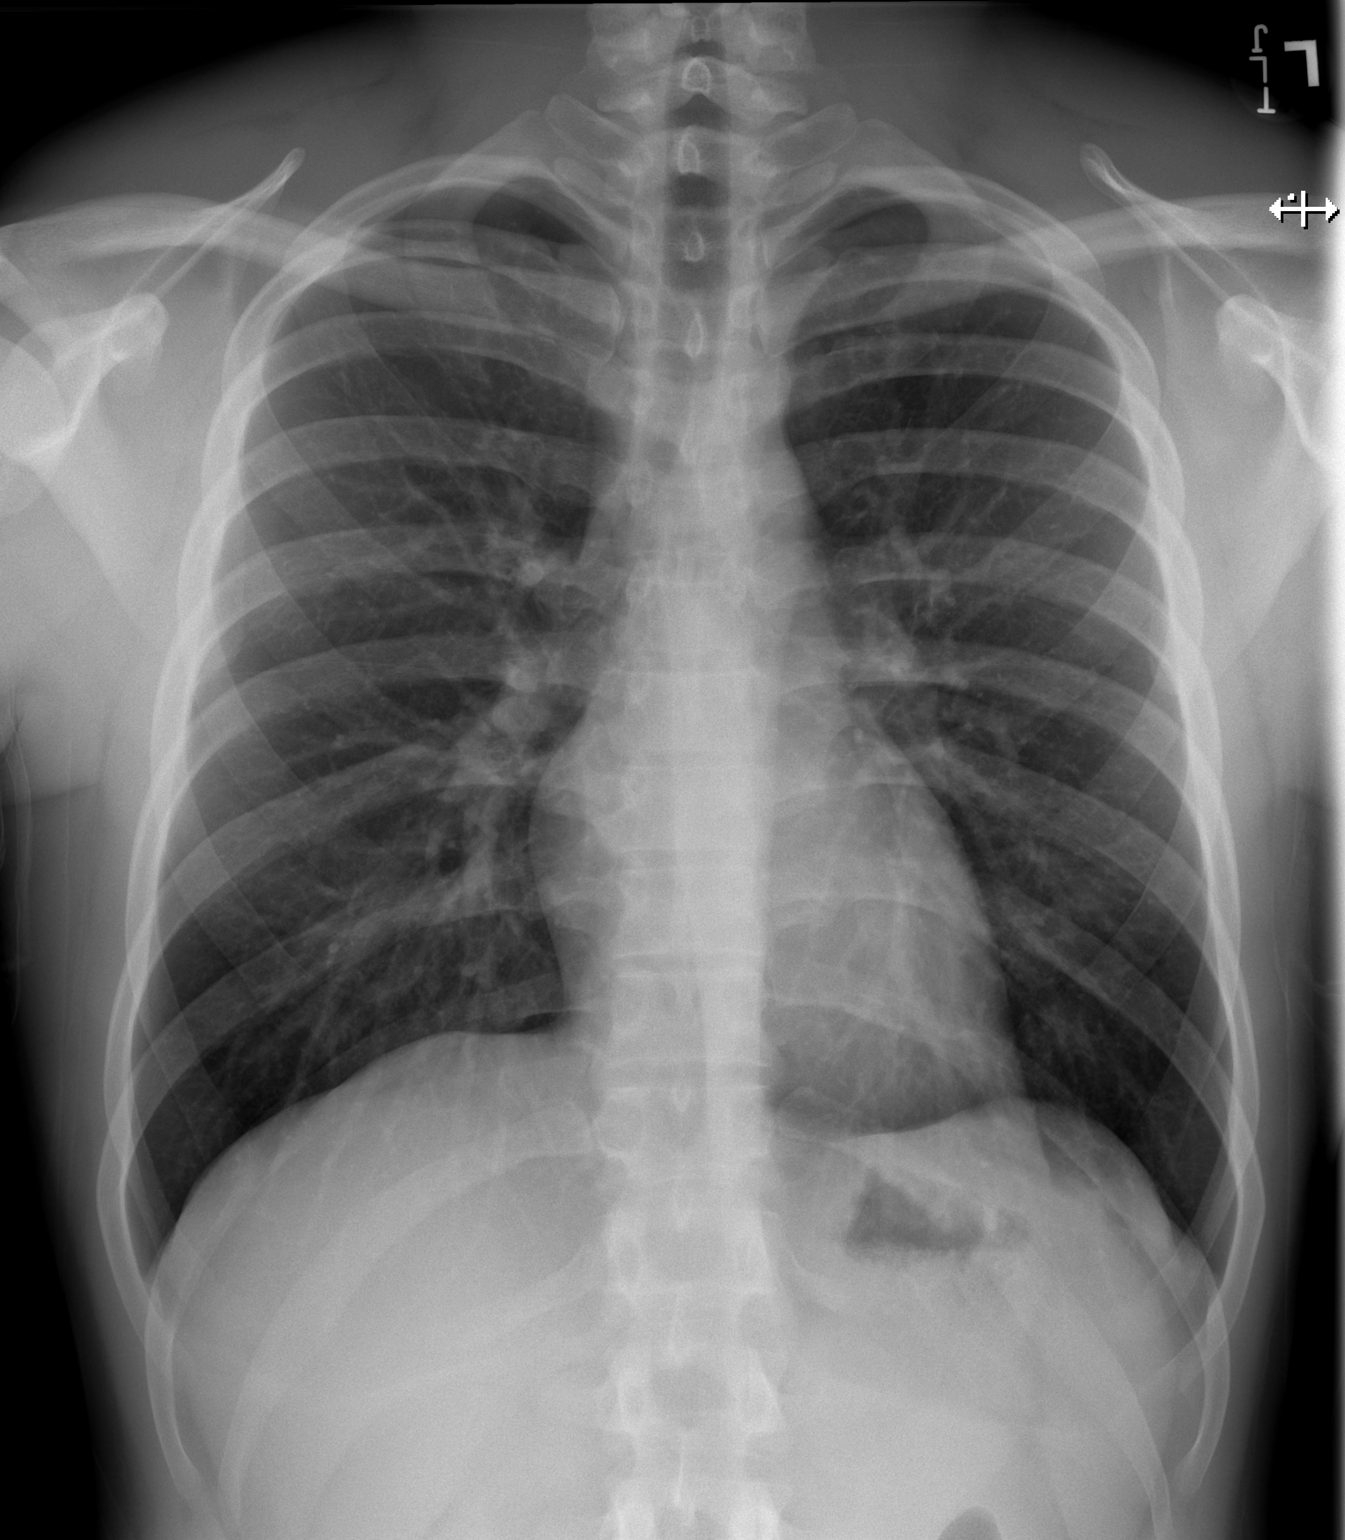

[1 of 1 positions shown; findings below may reference images not displayed]

FINDINGS: The heart size and mediastinal contours are within normal limits.
Both lungs are clear. The visualized skeletal structures are
unremarkable.
IMPRESSION: No active disease.
